# Patient Record
Sex: Male | Born: 1965 | Race: Black or African American | Hispanic: No | Marital: Single | Smoking: Current every day smoker
Health system: Southern US, Community
[De-identification: ages and names within clinical notes are randomized; demographics above are authoritative.]

---

## 2003-01-15 ENCOUNTER — Emergency Department (HOSPITAL_COMMUNITY): Admission: EM | Admit: 2003-01-15 | Discharge: 2003-01-15 | Payer: Self-pay | Admitting: *Deleted

## 2003-01-15 ENCOUNTER — Encounter: Payer: Self-pay | Admitting: *Deleted

## 2003-10-22 ENCOUNTER — Emergency Department (HOSPITAL_COMMUNITY): Admission: EM | Admit: 2003-10-22 | Discharge: 2003-10-22 | Payer: Self-pay | Admitting: Emergency Medicine

## 2007-04-14 ENCOUNTER — Emergency Department (HOSPITAL_COMMUNITY): Admission: EM | Admit: 2007-04-14 | Discharge: 2007-04-14 | Payer: Self-pay | Admitting: Emergency Medicine

## 2008-05-28 ENCOUNTER — Emergency Department (HOSPITAL_COMMUNITY): Admission: EM | Admit: 2008-05-28 | Discharge: 2008-05-28 | Payer: Self-pay | Admitting: Emergency Medicine

## 2014-07-18 ENCOUNTER — Emergency Department (HOSPITAL_COMMUNITY)
Admission: EM | Admit: 2014-07-18 | Discharge: 2014-07-19 | Disposition: A | Discharge status: Home / Self Care | Payer: No Typology Code available for payment source | Attending: Emergency Medicine | Admitting: Emergency Medicine

## 2014-07-18 ENCOUNTER — Encounter (HOSPITAL_COMMUNITY): Payer: Self-pay | Admitting: Emergency Medicine

## 2014-07-18 ENCOUNTER — Emergency Department (HOSPITAL_COMMUNITY): Payer: No Typology Code available for payment source

## 2014-07-18 DIAGNOSIS — Z79899 Other long term (current) drug therapy: Secondary | ICD-10-CM | POA: Insufficient documentation

## 2014-07-18 DIAGNOSIS — Y9389 Activity, other specified: Secondary | ICD-10-CM | POA: Diagnosis not present

## 2014-07-18 DIAGNOSIS — S0081XA Abrasion of other part of head, initial encounter: Secondary | ICD-10-CM | POA: Insufficient documentation

## 2014-07-18 DIAGNOSIS — Y92410 Unspecified street and highway as the place of occurrence of the external cause: Secondary | ICD-10-CM | POA: Diagnosis not present

## 2014-07-18 DIAGNOSIS — S4990XA Unspecified injury of shoulder and upper arm, unspecified arm, initial encounter: Secondary | ICD-10-CM | POA: Diagnosis not present

## 2014-07-18 DIAGNOSIS — M25559 Pain in unspecified hip: Secondary | ICD-10-CM

## 2014-07-18 DIAGNOSIS — M25551 Pain in right hip: Secondary | ICD-10-CM | POA: Diagnosis not present

## 2014-07-18 DIAGNOSIS — Z791 Long term (current) use of non-steroidal anti-inflammatories (NSAID): Secondary | ICD-10-CM | POA: Diagnosis not present

## 2014-07-18 DIAGNOSIS — S6992XA Unspecified injury of left wrist, hand and finger(s), initial encounter: Secondary | ICD-10-CM | POA: Diagnosis not present

## 2014-07-18 DIAGNOSIS — S79911A Unspecified injury of right hip, initial encounter: Secondary | ICD-10-CM | POA: Insufficient documentation

## 2014-07-18 DIAGNOSIS — Y998 Other external cause status: Secondary | ICD-10-CM | POA: Diagnosis not present

## 2014-07-18 DIAGNOSIS — Z720 Tobacco use: Secondary | ICD-10-CM | POA: Insufficient documentation

## 2014-07-18 DIAGNOSIS — S8991XA Unspecified injury of right lower leg, initial encounter: Secondary | ICD-10-CM | POA: Diagnosis not present

## 2014-07-18 DIAGNOSIS — T1490XA Injury, unspecified, initial encounter: Secondary | ICD-10-CM

## 2014-07-18 LAB — CBC
HCT: 42.5 % (ref 39.0–52.0)
Hemoglobin: 14.2 g/dL (ref 13.0–17.0)
MCH: 30.5 pg (ref 26.0–34.0)
MCHC: 33.4 g/dL (ref 30.0–36.0)
MCV: 91.2 fL (ref 78.0–100.0)
Platelets: 252 10*3/uL (ref 150–400)
RBC: 4.66 MIL/uL (ref 4.22–5.81)
RDW: 12.6 % (ref 11.5–15.5)
WBC: 7.6 10*3/uL (ref 4.0–10.5)

## 2014-07-18 LAB — I-STAT CHEM 8, ED
BUN: 9 mg/dL (ref 6–23)
CREATININE: 1 mg/dL (ref 0.50–1.35)
Calcium, Ion: 1.05 mmol/L — ABNORMAL LOW (ref 1.12–1.23)
Chloride: 101 mEq/L (ref 96–112)
Glucose, Bld: 76 mg/dL (ref 70–99)
HCT: 49 % (ref 39.0–52.0)
Hemoglobin: 16.7 g/dL (ref 13.0–17.0)
POTASSIUM: 3.9 meq/L (ref 3.7–5.3)
Sodium: 137 mEq/L (ref 137–147)
TCO2: 22 mmol/L (ref 0–100)

## 2014-07-18 LAB — COMPREHENSIVE METABOLIC PANEL
ALBUMIN: 4.1 g/dL (ref 3.5–5.2)
ALK PHOS: 58 U/L (ref 39–117)
ALT: 26 U/L (ref 0–53)
ANION GAP: 16 — AB (ref 5–15)
AST: 22 U/L (ref 0–37)
BILIRUBIN TOTAL: 0.3 mg/dL (ref 0.3–1.2)
BUN: 9 mg/dL (ref 6–23)
CHLORIDE: 99 meq/L (ref 96–112)
CO2: 21 meq/L (ref 19–32)
Calcium: 8.9 mg/dL (ref 8.4–10.5)
Creatinine, Ser: 0.87 mg/dL (ref 0.50–1.35)
GFR calc Af Amer: >90 mL/min (ref >90)
GFR calc non Af Amer: >90 mL/min (ref >90)
Glucose, Bld: 69 mg/dL — ABNORMAL LOW (ref 70–99)
POTASSIUM: 4 meq/L (ref 3.7–5.3)
Sodium: 136 mEq/L — ABNORMAL LOW (ref 137–147)
Total Protein: 7.8 g/dL (ref 6.0–8.3)

## 2014-07-18 LAB — PROTIME-INR
INR: 1.01 (ref 0.00–1.49)
PROTHROMBIN TIME: 13.4 s (ref 11.6–15.2)

## 2014-07-18 LAB — CDS SEROLOGY

## 2014-07-18 LAB — SAMPLE TO BLOOD BANK

## 2014-07-18 LAB — I-STAT CG4 LACTIC ACID, ED: Lactic Acid, Venous: 1.67 mmol/L (ref 0.5–2.2)

## 2014-07-18 LAB — ETHANOL: Alcohol, Ethyl (B): 112 mg/dL — ABNORMAL HIGH (ref 0–11)

## 2014-07-18 MED ORDER — FENTANYL CITRATE 0.05 MG/ML IJ SOLN
100.0000 ug | Freq: Once | INTRAMUSCULAR | Status: AC
  Administered 2014-07-18: 100 ug via INTRAVENOUS

## 2014-07-18 MED ORDER — SODIUM CHLORIDE 0.9 % IV BOLUS (SEPSIS)
1000.0000 mL | Freq: Once | INTRAVENOUS | Status: AC
  Administered 2014-07-18: 1000 mL via INTRAVENOUS

## 2014-07-18 MED ORDER — MORPHINE SULFATE 4 MG/ML IJ SOLN
6.0000 mg | Freq: Once | INTRAMUSCULAR | Status: AC
  Administered 2014-07-18: 6 mg via INTRAVENOUS
  Filled 2014-07-18: qty 2

## 2014-07-18 MED ORDER — FENTANYL CITRATE 0.05 MG/ML IJ SOLN
INTRAMUSCULAR | Status: AC
  Filled 2014-07-18: qty 2
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
## 2014-07-18 NOTE — ED Notes (Signed)
Patient to CT.
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
## 2014-07-18 NOTE — ED Notes (Signed)
Patient returned from CT

## 2014-07-18 NOTE — ED Notes (Addendum)
I Stat Lactic Acid results shown to Dr. Theodoro GristA. Nanavati

## 2014-07-18 NOTE — ED Notes (Signed)
Patient returned from xray.

## 2014-07-18 NOTE — Progress Notes (Signed)
Orthopedic Tech Progress Note Patient Details:  Jeremy Irwin 1966-02-08 [id number]  Ortho Devices Type of Ortho Device: Ace wrap, Volar splint Ortho Device/Splint Location: LUE Ortho Device/Splint Interventions: Ordered, Application   Jennye MoccasinHughes, [name] 07/18/2014, 9:16 PM

## 2014-07-18 NOTE — ED Notes (Signed)
Family at bedside. 

## 2014-07-18 NOTE — ED Notes (Signed)
Patient transported to X-ray 
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
## 2014-07-18 NOTE — ED Notes (Signed)
Patient ambulated with assistance, patient walked approx 5 feet, right hip became painful and was not able to ambulate any further.

## 2014-07-18 NOTE — ED Notes (Signed)
Ortho to place short arm splint

## 2014-07-19 MED ORDER — OXYCODONE-ACETAMINOPHEN 5-325 MG PO TABS
2.0000 | ORAL_TABLET | Freq: Once | ORAL | Status: AC
Start: 2014-07-19 — End: 2014-07-19
  Administered 2014-07-19: 2 via ORAL
  Filled 2014-07-19: qty 2

## 2014-07-19 MED ORDER — OXYCODONE-ACETAMINOPHEN 5-325 MG PO TABS: 1.0000 | ORAL_TABLET | ORAL | Status: AC | PRN
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
